# Patient Record
Sex: Male | Born: 1975 | Race: Asian | Hispanic: No | Marital: Married | Smoking: Former smoker
Health system: Southern US, Community
[De-identification: ages and names within clinical notes are randomized; demographics above are authoritative.]

---

## 2015-01-30 ENCOUNTER — Emergency Department (HOSPITAL_COMMUNITY): Payer: Managed Care, Other (non HMO)

## 2015-01-30 ENCOUNTER — Emergency Department (HOSPITAL_COMMUNITY)
Admission: EM | Admit: 2015-01-30 | Discharge: 2015-01-30 | Disposition: A | Discharge status: Home / Self Care | Payer: Managed Care, Other (non HMO) | Attending: Emergency Medicine | Admitting: Emergency Medicine

## 2015-01-30 ENCOUNTER — Encounter (HOSPITAL_COMMUNITY): Payer: Self-pay | Admitting: Emergency Medicine

## 2015-01-30 DIAGNOSIS — Z87891 Personal history of nicotine dependence: Secondary | ICD-10-CM | POA: Diagnosis not present

## 2015-01-30 DIAGNOSIS — Y9389 Activity, other specified: Secondary | ICD-10-CM | POA: Diagnosis not present

## 2015-01-30 DIAGNOSIS — S8012XA Contusion of left lower leg, initial encounter: Secondary | ICD-10-CM | POA: Diagnosis not present

## 2015-01-30 DIAGNOSIS — S80819A Abrasion, unspecified lower leg, initial encounter: Secondary | ICD-10-CM

## 2015-01-30 DIAGNOSIS — Z23 Encounter for immunization: Secondary | ICD-10-CM | POA: Insufficient documentation

## 2015-01-30 DIAGNOSIS — Y92410 Unspecified street and highway as the place of occurrence of the external cause: Secondary | ICD-10-CM | POA: Diagnosis not present

## 2015-01-30 DIAGNOSIS — Y998 Other external cause status: Secondary | ICD-10-CM | POA: Insufficient documentation

## 2015-01-30 DIAGNOSIS — S80811A Abrasion, right lower leg, initial encounter: Secondary | ICD-10-CM | POA: Diagnosis not present

## 2015-01-30 DIAGNOSIS — S8992XA Unspecified injury of left lower leg, initial encounter: Secondary | ICD-10-CM | POA: Diagnosis present

## 2015-01-30 DIAGNOSIS — S80812A Abrasion, left lower leg, initial encounter: Secondary | ICD-10-CM | POA: Diagnosis not present

## 2015-01-30 MED ORDER — OXYCODONE-ACETAMINOPHEN 5-325 MG PO TABS
1.0000 | ORAL_TABLET | Freq: Once | ORAL | Status: AC
  Administered 2015-01-30: 1 via ORAL

## 2015-01-30 MED ORDER — TETANUS-DIPHTH-ACELL PERTUSSIS 5-2.5-18.5 LF-MCG/0.5 IM SUSP
0.5000 mL | Freq: Once | INTRAMUSCULAR | Status: AC
  Administered 2015-01-30: 0.5 mL via INTRAMUSCULAR
  Filled 2015-01-30: qty 0.5

## 2015-01-30 MED ORDER — OXYCODONE-ACETAMINOPHEN 5-325 MG PO TABS
ORAL_TABLET | ORAL | Status: AC
  Filled 2015-01-30: qty 1

## 2015-01-30 MED ORDER — HYDROCODONE-ACETAMINOPHEN 5-325 MG PO TABS: 1.0000 | ORAL_TABLET | Freq: Four times a day (QID) | ORAL | Status: AC | PRN
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
## 2015-01-30 NOTE — ED Notes (Signed)
Provider notified of patient's blood pressure. Patient verbalized blood pressure management and will follow up with primary Doctor.
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
## 2015-01-31 ENCOUNTER — Telehealth: Payer: Self-pay | Admitting: *Deleted

## 2015-01-31 NOTE — ED Notes (Signed)
Note for work provided for patient

## 2015-02-03 ENCOUNTER — Emergency Department (INDEPENDENT_AMBULATORY_CARE_PROVIDER_SITE_OTHER)
Admission: EM | Admit: 2015-02-03 | Discharge: 2015-02-03 | Disposition: A | Discharge status: Home / Self Care | Payer: Self-pay | Source: Home / Self Care | Attending: Family Medicine | Admitting: Family Medicine

## 2015-02-03 ENCOUNTER — Encounter (HOSPITAL_COMMUNITY): Payer: Self-pay | Admitting: Emergency Medicine

## 2015-02-03 DIAGNOSIS — S80812D Abrasion, left lower leg, subsequent encounter: Secondary | ICD-10-CM

## 2015-02-03 DIAGNOSIS — S8012XD Contusion of left lower leg, subsequent encounter: Secondary | ICD-10-CM
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
## 2015-02-05 ENCOUNTER — Other Ambulatory Visit (HOSPITAL_COMMUNITY): Payer: Self-pay | Admitting: Orthopaedic Surgery

## 2015-02-05 DIAGNOSIS — M7989 Other specified soft tissue disorders: Principal | ICD-10-CM

## 2015-02-05 DIAGNOSIS — M79662 Pain in left lower leg: Secondary | ICD-10-CM

## 2015-02-06 ENCOUNTER — Ambulatory Visit (HOSPITAL_COMMUNITY)
Admission: RE | Admit: 2015-02-06 | Discharge: 2015-02-06 | Disposition: A | Discharge status: Home / Self Care | Payer: Managed Care, Other (non HMO) | Source: Ambulatory Visit | Attending: Internal Medicine | Admitting: Internal Medicine

## 2015-02-06 DIAGNOSIS — M79662 Pain in left lower leg: Secondary | ICD-10-CM

## 2015-02-06 DIAGNOSIS — M7989 Other specified soft tissue disorders: Secondary | ICD-10-CM | POA: Diagnosis not present

## 2015-02-06 DIAGNOSIS — M79605 Pain in left leg: Secondary | ICD-10-CM

## 2015-02-06 NOTE — Progress Notes (Signed)
*  Preliminary Results* Left lower extremity venous duplex completed. Left lower extremity is negative for deep vein thrombosis. There is no evidence of left Baker's cyst.  Preliminary results discussed with Thereasa Distance of Dr. Hoy Register office.  02/06/2015 9:51 AM  Gertie Fey, RVT, RDCS, RDMS

## 2017-05-27 ENCOUNTER — Other Ambulatory Visit: Payer: Self-pay | Admitting: Family Medicine

## 2017-05-27 ENCOUNTER — Ambulatory Visit
Admission: RE | Admit: 2017-05-27 | Discharge: 2017-05-27 | Disposition: A | Discharge status: Home / Self Care | Payer: BLUE CROSS/BLUE SHIELD | Source: Ambulatory Visit | Attending: Family Medicine | Admitting: Family Medicine

## 2017-05-27 DIAGNOSIS — Z111 Encounter for screening for respiratory tuberculosis: Secondary | ICD-10-CM

## 2018-01-03 IMAGING — CR DG CHEST 2V
2 series · 2 of 2 positions shown · non-contrast
Comparison: None.

CLINICAL DATA: Pos PPD / no chest complaints / clearance needed for
school / jdh 315

EXAM:
CHEST  2 VIEW

[w chest pa]
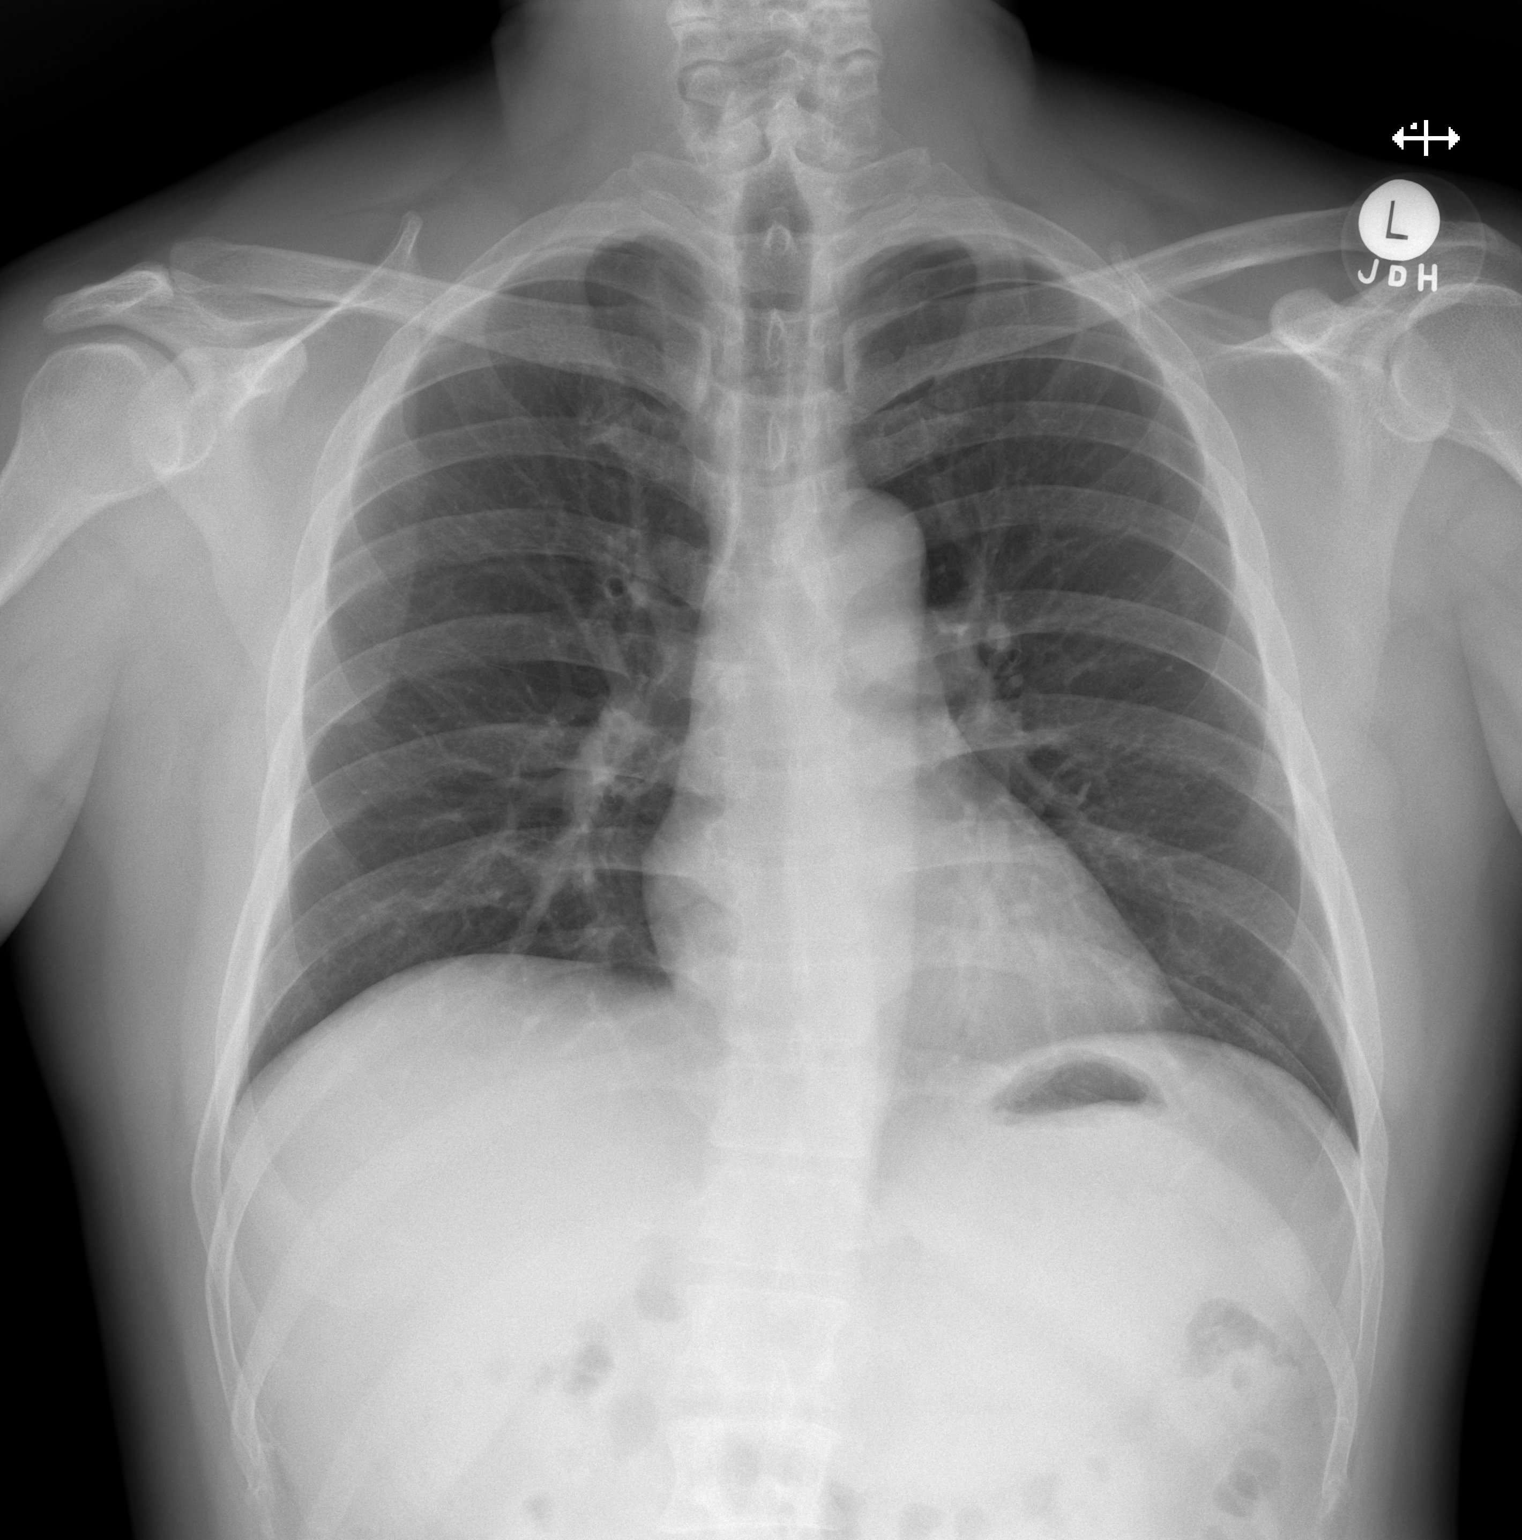

[w chest lat]
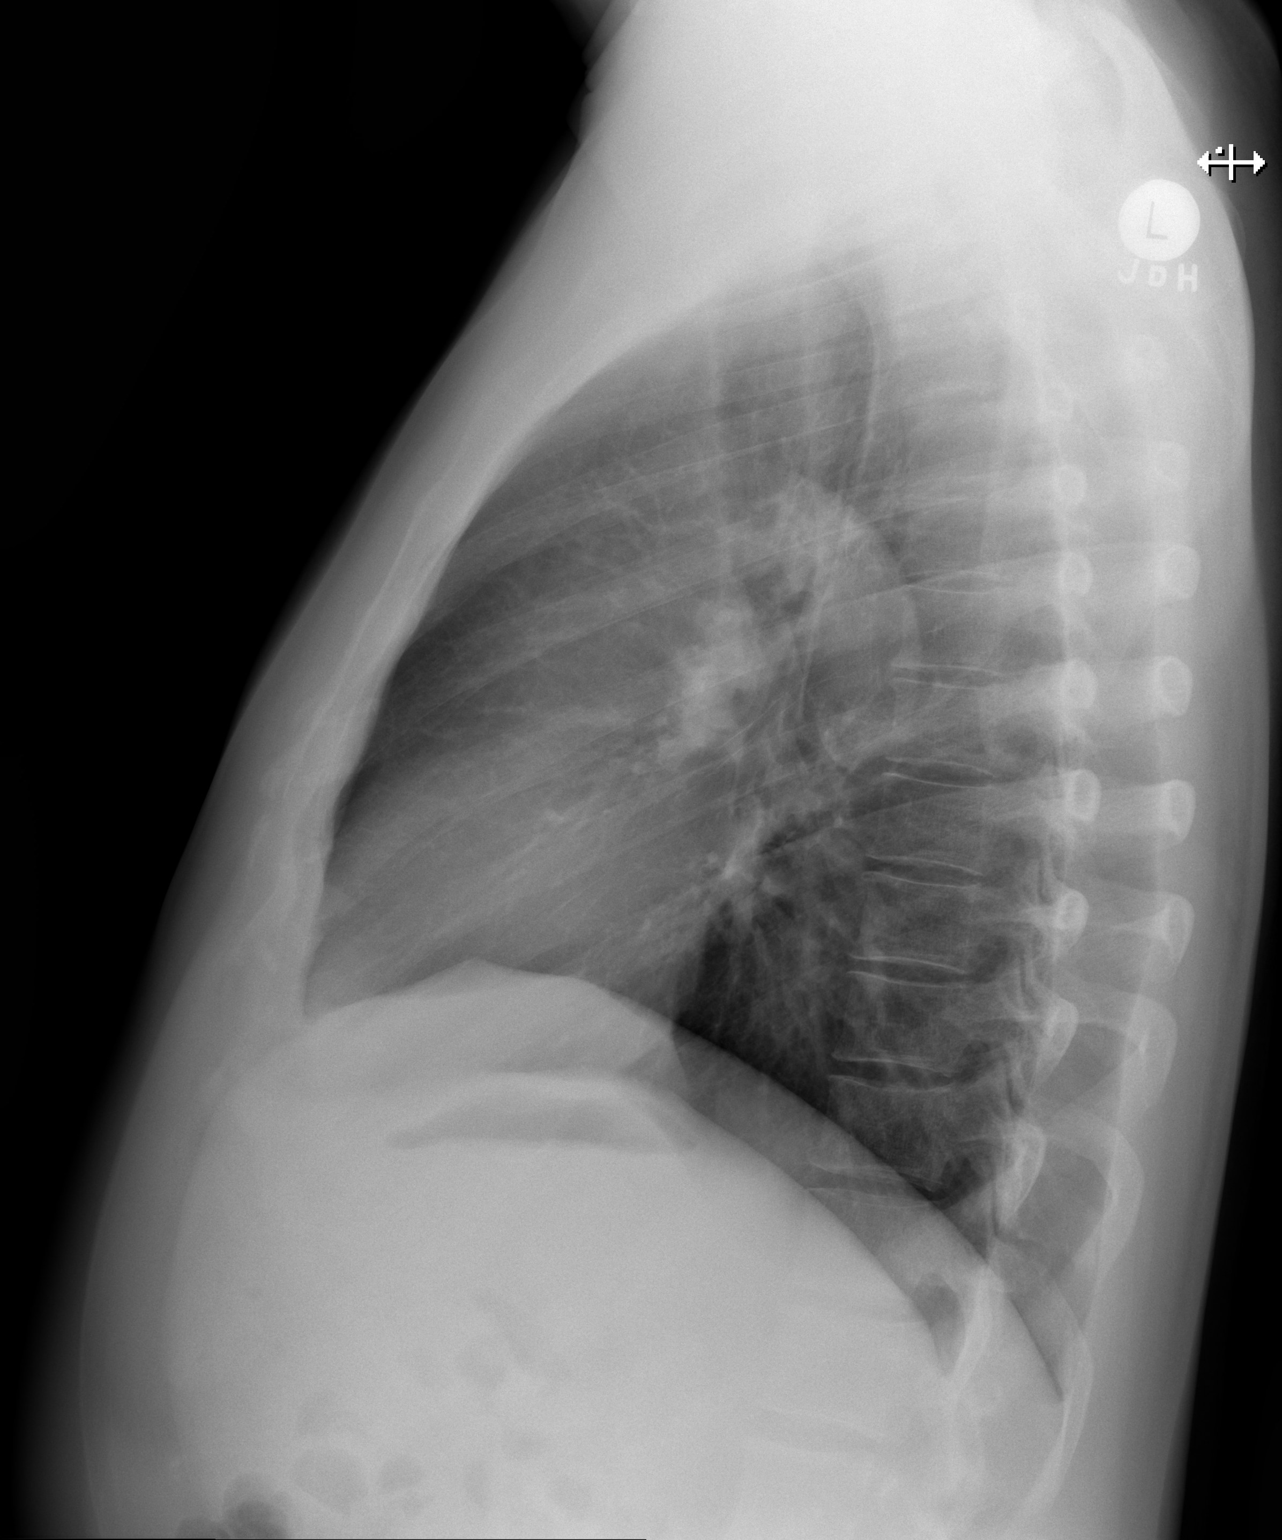

[2 of 2 positions shown; findings below may reference images not displayed]

FINDINGS: The heart size and mediastinal contours are within normal limits.
Both lungs are clear. The visualized skeletal structures are
unremarkable.
IMPRESSION: No active cardiopulmonary disease.

## 2023-04-20 ENCOUNTER — Telehealth: Payer: Self-pay

## 2023-04-20 NOTE — Telephone Encounter (Signed)
Please assist patient with scheduling, thanks.   [facility] Day - Client Nonclinical Telephone Record  [facility] Client [facility] Day - Client Client Site [facility] - Day Contact Type Call Who Is Calling Patient / Member / Family / Caregiver Caller Name Tu Britting Caller Phone Number [phone] Patient Name Randall Morris Patient DOB 11-26-1956 Call Type Message Only Information Provided Reason for Call Request to Schedule Office Appointment Initial Comment Caller states his dad has just moved here from [place] and would like to know if he can be a new patient. He does have BP medications. Unable to reach office through backline. Disp. Time Disposition Final User 04/20/2023 12:53:06 PM General Information Provided Yes Talmage Coin Call Closed By: Talmage Coin Transaction Date/Time: 04/20/2023 12:44:52 PM (ET)

## 2023-04-21 NOTE — Telephone Encounter (Signed)
[id number] to schedule new patient appt
